# Patient Record
Sex: Female | Born: 1975 | Race: White | Hispanic: No | Marital: Married
Health system: Southern US, Community
[De-identification: ages and names within clinical notes are randomized; demographics above are authoritative.]

## PROBLEM LIST (undated history)

## (undated) DIAGNOSIS — K219 Gastro-esophageal reflux disease without esophagitis: Secondary | ICD-10-CM

## (undated) HISTORY — DX: Gastro-esophageal reflux disease without esophagitis: K21.9

---

## 2007-11-09 ENCOUNTER — Ambulatory Visit (HOSPITAL_BASED_OUTPATIENT_CLINIC_OR_DEPARTMENT_OTHER): Admission: RE | Admit: 2007-11-09 | Discharge: 2007-11-09 | Payer: Self-pay | Admitting: Orthopedic Surgery

## 2010-09-09 NOTE — Op Note (Signed)
NAME:  Gerads, Rheannon                ACCOUNT NO.:  [id number]   MEDICAL RECORD NO.:  [id number]          PATIENT TYPE:  AMB   LOCATION:  DSC                          FACILITY:  [facility]   PHYSICIAN:  Harvie Junior, M.D.   DATE OF BIRTH:  04/16/1976   DATE OF PROCEDURE:  11/09/2007  DATE OF DISCHARGE:                               OPERATIVE REPORT   PREOPERATIVE DIAGNOSES:  Suspected chondral injury, left wrist.   POSTOPERATIVE DIAGNOSES:  Suspected chondral injury, left wrist.   PROCEDURES:  Left wrist arthroscopy with debridement of chondral flap of  synovitis.   SURGEON:  Harvie Junior, M.D.   ASSISTANT:  Marshia Ly, P.A.   ANESTHESIA:  General.   BRIEF HISTORY:  Ms. Moline is a 35 year old female with a long history  of having significant left wrist pain, which we treated conservatively  for a prolonged period of time.  Because of continuing complaints of  pain, she was ultimately taken to the operating room for evaluation.  Preoperative MRI really showed no evidence of ligamentous injury.   PROCEDURE:  The patient was taken to the operating room.  After adequate  anesthesia obtained with general anesthetic, the patient was placed  supine on the operating table.  The left wrist was then prepped and  draped in the usual sterile fashion.  Following this, the arm was  exsanguinated and blood pressure inflated to 250 mmHg.  Following this,  2 elliptical incisions were made just medial and lateral to the fourth  extensor compartment and the wrist arthroscopy was performed.  There was  a significant chondral injury in the dorsal aspect of the lunate, which  was debrided.  There was a couple of chondral loose bodies, which were  identified and debrided.  There was a fair amount of synovitis both on  the radial and ulnar aspects of the wrist.  Triangular fibrocartilage  was evaluated and felt to be normal.  At this point, after this  aggressive and thorough debridement of the  chondral injury and the  synovitis, the joint surface was copiously irrigated, suctioned dry, and  all sterile portals were closed with a single stitch.  Sterile  compression dressing was applied as well as a volar plaster, and the  patient was taken to the recovery room.  She was noted to be in a  satisfactory condition.  Estimated blood loss for this procedure was  none.      Harvie Junior, M.D.  Electronically Signed     JLG/MEDQ  D:  11/09/2007  T:  11/10/2007  Job:  [id number]

## 2011-01-23 LAB — POCT HEMOGLOBIN-HEMACUE: Hemoglobin: 11.9 — ABNORMAL LOW

## 2015-08-29 DIAGNOSIS — M9905 Segmental and somatic dysfunction of pelvic region: Secondary | ICD-10-CM | POA: Diagnosis not present

## 2015-08-29 DIAGNOSIS — M545 Low back pain: Secondary | ICD-10-CM | POA: Diagnosis not present

## 2015-08-29 DIAGNOSIS — M62830 Muscle spasm of back: Secondary | ICD-10-CM | POA: Diagnosis not present

## 2015-08-29 DIAGNOSIS — M9903 Segmental and somatic dysfunction of lumbar region: Secondary | ICD-10-CM | POA: Diagnosis not present

## 2015-09-05 DIAGNOSIS — M9905 Segmental and somatic dysfunction of pelvic region: Secondary | ICD-10-CM | POA: Diagnosis not present

## 2015-09-05 DIAGNOSIS — M9903 Segmental and somatic dysfunction of lumbar region: Secondary | ICD-10-CM | POA: Diagnosis not present

## 2015-09-05 DIAGNOSIS — M62830 Muscle spasm of back: Secondary | ICD-10-CM | POA: Diagnosis not present

## 2015-09-05 DIAGNOSIS — M545 Low back pain: Secondary | ICD-10-CM | POA: Diagnosis not present

## 2015-09-12 DIAGNOSIS — M9905 Segmental and somatic dysfunction of pelvic region: Secondary | ICD-10-CM | POA: Diagnosis not present

## 2015-09-12 DIAGNOSIS — M62830 Muscle spasm of back: Secondary | ICD-10-CM | POA: Diagnosis not present

## 2015-09-12 DIAGNOSIS — M9903 Segmental and somatic dysfunction of lumbar region: Secondary | ICD-10-CM | POA: Diagnosis not present

## 2015-09-12 DIAGNOSIS — M545 Low back pain: Secondary | ICD-10-CM | POA: Diagnosis not present

## 2015-09-26 DIAGNOSIS — M9903 Segmental and somatic dysfunction of lumbar region: Secondary | ICD-10-CM | POA: Diagnosis not present

## 2015-09-26 DIAGNOSIS — M9905 Segmental and somatic dysfunction of pelvic region: Secondary | ICD-10-CM | POA: Diagnosis not present

## 2015-09-26 DIAGNOSIS — M545 Low back pain: Secondary | ICD-10-CM | POA: Diagnosis not present

## 2015-09-26 DIAGNOSIS — M62830 Muscle spasm of back: Secondary | ICD-10-CM | POA: Diagnosis not present

## 2015-10-17 DIAGNOSIS — M9905 Segmental and somatic dysfunction of pelvic region: Secondary | ICD-10-CM | POA: Diagnosis not present

## 2015-10-17 DIAGNOSIS — M62830 Muscle spasm of back: Secondary | ICD-10-CM | POA: Diagnosis not present

## 2015-10-17 DIAGNOSIS — M9903 Segmental and somatic dysfunction of lumbar region: Secondary | ICD-10-CM | POA: Diagnosis not present

## 2015-10-17 DIAGNOSIS — M545 Low back pain: Secondary | ICD-10-CM | POA: Diagnosis not present

## 2015-11-01 DIAGNOSIS — M545 Low back pain: Secondary | ICD-10-CM | POA: Diagnosis not present

## 2015-11-01 DIAGNOSIS — M9905 Segmental and somatic dysfunction of pelvic region: Secondary | ICD-10-CM | POA: Diagnosis not present

## 2015-11-01 DIAGNOSIS — M62830 Muscle spasm of back: Secondary | ICD-10-CM | POA: Diagnosis not present

## 2015-11-01 DIAGNOSIS — M9903 Segmental and somatic dysfunction of lumbar region: Secondary | ICD-10-CM | POA: Diagnosis not present

## 2016-01-23 DIAGNOSIS — Z6841 Body Mass Index (BMI) 40.0 and over, adult: Secondary | ICD-10-CM | POA: Diagnosis not present

## 2016-01-23 DIAGNOSIS — Z0000 Encounter for general adult medical examination without abnormal findings: Secondary | ICD-10-CM | POA: Diagnosis not present

## 2016-03-09 DIAGNOSIS — R55 Syncope and collapse: Secondary | ICD-10-CM | POA: Diagnosis not present

## 2016-04-29 DIAGNOSIS — Z113 Encounter for screening for infections with a predominantly sexual mode of transmission: Secondary | ICD-10-CM | POA: Diagnosis not present

## 2016-04-29 DIAGNOSIS — N760 Acute vaginitis: Secondary | ICD-10-CM | POA: Diagnosis not present

## 2016-04-29 DIAGNOSIS — A630 Anogenital (venereal) warts: Secondary | ICD-10-CM | POA: Diagnosis not present

## 2016-04-30 DIAGNOSIS — A630 Anogenital (venereal) warts: Secondary | ICD-10-CM | POA: Diagnosis not present

## 2016-04-30 DIAGNOSIS — Z113 Encounter for screening for infections with a predominantly sexual mode of transmission: Secondary | ICD-10-CM | POA: Diagnosis not present

## 2016-04-30 DIAGNOSIS — N760 Acute vaginitis: Secondary | ICD-10-CM | POA: Diagnosis not present

## 2016-05-22 DIAGNOSIS — M545 Low back pain: Secondary | ICD-10-CM | POA: Diagnosis not present

## 2016-05-22 DIAGNOSIS — M9905 Segmental and somatic dysfunction of pelvic region: Secondary | ICD-10-CM | POA: Diagnosis not present

## 2016-05-22 DIAGNOSIS — M62830 Muscle spasm of back: Secondary | ICD-10-CM | POA: Diagnosis not present

## 2016-05-22 DIAGNOSIS — M9903 Segmental and somatic dysfunction of lumbar region: Secondary | ICD-10-CM | POA: Diagnosis not present

## 2016-06-25 DIAGNOSIS — Z713 Dietary counseling and surveillance: Secondary | ICD-10-CM | POA: Diagnosis not present

## 2016-06-25 DIAGNOSIS — Z6841 Body Mass Index (BMI) 40.0 and over, adult: Secondary | ICD-10-CM | POA: Diagnosis not present

## 2016-07-30 DIAGNOSIS — Z713 Dietary counseling and surveillance: Secondary | ICD-10-CM | POA: Diagnosis not present

## 2016-10-22 DIAGNOSIS — M9903 Segmental and somatic dysfunction of lumbar region: Secondary | ICD-10-CM | POA: Diagnosis not present

## 2016-10-22 DIAGNOSIS — M62830 Muscle spasm of back: Secondary | ICD-10-CM | POA: Diagnosis not present

## 2016-10-22 DIAGNOSIS — M9905 Segmental and somatic dysfunction of pelvic region: Secondary | ICD-10-CM | POA: Diagnosis not present

## 2016-10-22 DIAGNOSIS — M545 Low back pain: Secondary | ICD-10-CM | POA: Diagnosis not present

## 2016-10-27 DIAGNOSIS — Z6841 Body Mass Index (BMI) 40.0 and over, adult: Secondary | ICD-10-CM | POA: Diagnosis not present

## 2016-10-27 DIAGNOSIS — Z1389 Encounter for screening for other disorder: Secondary | ICD-10-CM | POA: Diagnosis not present

## 2016-10-27 DIAGNOSIS — Z0000 Encounter for general adult medical examination without abnormal findings: Secondary | ICD-10-CM | POA: Diagnosis not present

## 2016-10-29 DIAGNOSIS — M62830 Muscle spasm of back: Secondary | ICD-10-CM | POA: Diagnosis not present

## 2016-10-29 DIAGNOSIS — M545 Low back pain: Secondary | ICD-10-CM | POA: Diagnosis not present

## 2016-10-29 DIAGNOSIS — M9905 Segmental and somatic dysfunction of pelvic region: Secondary | ICD-10-CM | POA: Diagnosis not present

## 2016-10-29 DIAGNOSIS — M9903 Segmental and somatic dysfunction of lumbar region: Secondary | ICD-10-CM | POA: Diagnosis not present

## 2016-11-12 DIAGNOSIS — M9903 Segmental and somatic dysfunction of lumbar region: Secondary | ICD-10-CM | POA: Diagnosis not present

## 2016-11-12 DIAGNOSIS — M545 Low back pain: Secondary | ICD-10-CM | POA: Diagnosis not present

## 2016-11-12 DIAGNOSIS — M62830 Muscle spasm of back: Secondary | ICD-10-CM | POA: Diagnosis not present

## 2016-11-12 DIAGNOSIS — M9905 Segmental and somatic dysfunction of pelvic region: Secondary | ICD-10-CM | POA: Diagnosis not present

## 2016-11-26 DIAGNOSIS — M9903 Segmental and somatic dysfunction of lumbar region: Secondary | ICD-10-CM | POA: Diagnosis not present

## 2016-11-26 DIAGNOSIS — M9905 Segmental and somatic dysfunction of pelvic region: Secondary | ICD-10-CM | POA: Diagnosis not present

## 2016-11-26 DIAGNOSIS — M62830 Muscle spasm of back: Secondary | ICD-10-CM | POA: Diagnosis not present

## 2016-11-26 DIAGNOSIS — M545 Low back pain: Secondary | ICD-10-CM | POA: Diagnosis not present

## 2017-05-13 DIAGNOSIS — M545 Low back pain: Secondary | ICD-10-CM | POA: Diagnosis not present

## 2017-05-13 DIAGNOSIS — M9905 Segmental and somatic dysfunction of pelvic region: Secondary | ICD-10-CM | POA: Diagnosis not present

## 2017-05-13 DIAGNOSIS — M9903 Segmental and somatic dysfunction of lumbar region: Secondary | ICD-10-CM | POA: Diagnosis not present

## 2017-05-13 DIAGNOSIS — M62830 Muscle spasm of back: Secondary | ICD-10-CM | POA: Diagnosis not present

## 2017-08-26 DIAGNOSIS — M6702 Short Achilles tendon (acquired), left ankle: Secondary | ICD-10-CM | POA: Diagnosis not present

## 2017-08-26 DIAGNOSIS — M722 Plantar fascial fibromatosis: Secondary | ICD-10-CM | POA: Diagnosis not present

## 2017-08-26 DIAGNOSIS — M6701 Short Achilles tendon (acquired), right ankle: Secondary | ICD-10-CM | POA: Diagnosis not present

## 2017-10-07 DIAGNOSIS — M722 Plantar fascial fibromatosis: Secondary | ICD-10-CM | POA: Diagnosis not present

## 2017-10-07 DIAGNOSIS — M6702 Short Achilles tendon (acquired), left ankle: Secondary | ICD-10-CM | POA: Diagnosis not present

## 2017-10-07 DIAGNOSIS — M6701 Short Achilles tendon (acquired), right ankle: Secondary | ICD-10-CM | POA: Diagnosis not present

## 2017-11-05 DIAGNOSIS — Z1389 Encounter for screening for other disorder: Secondary | ICD-10-CM | POA: Diagnosis not present

## 2017-11-05 DIAGNOSIS — Z01419 Encounter for gynecological examination (general) (routine) without abnormal findings: Secondary | ICD-10-CM | POA: Diagnosis not present

## 2017-11-05 DIAGNOSIS — Z6841 Body Mass Index (BMI) 40.0 and over, adult: Secondary | ICD-10-CM | POA: Diagnosis not present

## 2017-11-25 DIAGNOSIS — Z01419 Encounter for gynecological examination (general) (routine) without abnormal findings: Secondary | ICD-10-CM | POA: Diagnosis not present

## 2017-11-25 DIAGNOSIS — Z6841 Body Mass Index (BMI) 40.0 and over, adult: Secondary | ICD-10-CM | POA: Diagnosis not present

## 2017-11-25 DIAGNOSIS — Z1389 Encounter for screening for other disorder: Secondary | ICD-10-CM | POA: Diagnosis not present

## 2017-11-26 DIAGNOSIS — Z1231 Encounter for screening mammogram for malignant neoplasm of breast: Secondary | ICD-10-CM | POA: Diagnosis not present

## 2018-03-03 DIAGNOSIS — M62830 Muscle spasm of back: Secondary | ICD-10-CM | POA: Diagnosis not present

## 2018-03-03 DIAGNOSIS — M9902 Segmental and somatic dysfunction of thoracic region: Secondary | ICD-10-CM | POA: Diagnosis not present

## 2018-03-03 DIAGNOSIS — M9903 Segmental and somatic dysfunction of lumbar region: Secondary | ICD-10-CM | POA: Diagnosis not present

## 2018-03-03 DIAGNOSIS — M545 Low back pain: Secondary | ICD-10-CM | POA: Diagnosis not present

## 2018-04-18 DIAGNOSIS — L919 Hypertrophic disorder of the skin, unspecified: Secondary | ICD-10-CM | POA: Diagnosis not present

## 2018-04-21 DIAGNOSIS — M25562 Pain in left knee: Secondary | ICD-10-CM | POA: Diagnosis not present

## 2018-04-21 DIAGNOSIS — Z6841 Body Mass Index (BMI) 40.0 and over, adult: Secondary | ICD-10-CM | POA: Diagnosis not present

## 2018-04-26 DIAGNOSIS — M25562 Pain in left knee: Secondary | ICD-10-CM | POA: Diagnosis not present

## 2018-05-26 DIAGNOSIS — M25562 Pain in left knee: Secondary | ICD-10-CM | POA: Diagnosis not present

## 2018-07-29 DIAGNOSIS — M9903 Segmental and somatic dysfunction of lumbar region: Secondary | ICD-10-CM | POA: Diagnosis not present

## 2018-07-29 DIAGNOSIS — M62830 Muscle spasm of back: Secondary | ICD-10-CM | POA: Diagnosis not present

## 2018-07-29 DIAGNOSIS — M545 Low back pain: Secondary | ICD-10-CM | POA: Diagnosis not present

## 2018-07-29 DIAGNOSIS — M9902 Segmental and somatic dysfunction of thoracic region: Secondary | ICD-10-CM | POA: Diagnosis not present

## 2018-08-18 DIAGNOSIS — M62830 Muscle spasm of back: Secondary | ICD-10-CM | POA: Diagnosis not present

## 2018-08-18 DIAGNOSIS — M9902 Segmental and somatic dysfunction of thoracic region: Secondary | ICD-10-CM | POA: Diagnosis not present

## 2018-08-18 DIAGNOSIS — M9903 Segmental and somatic dysfunction of lumbar region: Secondary | ICD-10-CM | POA: Diagnosis not present

## 2018-08-18 DIAGNOSIS — M545 Low back pain: Secondary | ICD-10-CM | POA: Diagnosis not present

## 2018-08-25 DIAGNOSIS — M25562 Pain in left knee: Secondary | ICD-10-CM | POA: Diagnosis not present

## 2018-10-13 ENCOUNTER — Other Ambulatory Visit: Payer: Self-pay | Admitting: General Surgery

## 2018-10-13 ENCOUNTER — Other Ambulatory Visit (HOSPITAL_COMMUNITY): Payer: Self-pay | Admitting: General Surgery

## 2018-10-18 ENCOUNTER — Other Ambulatory Visit: Payer: Self-pay

## 2018-10-18 ENCOUNTER — Other Ambulatory Visit (HOSPITAL_COMMUNITY): Payer: Self-pay | Admitting: General Surgery

## 2018-10-18 ENCOUNTER — Ambulatory Visit (HOSPITAL_COMMUNITY)
Admission: RE | Admit: 2018-10-18 | Discharge: 2018-10-18 | Disposition: A | Discharge status: Home / Self Care | Payer: BC Managed Care – PPO | Source: Ambulatory Visit | Attending: General Surgery | Admitting: General Surgery

## 2018-10-18 DIAGNOSIS — K224 Dyskinesia of esophagus: Secondary | ICD-10-CM | POA: Diagnosis not present

## 2018-10-18 DIAGNOSIS — R918 Other nonspecific abnormal finding of lung field: Secondary | ICD-10-CM | POA: Diagnosis not present

## 2018-10-27 DIAGNOSIS — M9902 Segmental and somatic dysfunction of thoracic region: Secondary | ICD-10-CM | POA: Diagnosis not present

## 2018-10-27 DIAGNOSIS — M545 Low back pain: Secondary | ICD-10-CM | POA: Diagnosis not present

## 2018-10-27 DIAGNOSIS — M62830 Muscle spasm of back: Secondary | ICD-10-CM | POA: Diagnosis not present

## 2018-10-27 DIAGNOSIS — M9903 Segmental and somatic dysfunction of lumbar region: Secondary | ICD-10-CM | POA: Diagnosis not present

## 2018-11-03 DIAGNOSIS — Z6841 Body Mass Index (BMI) 40.0 and over, adult: Secondary | ICD-10-CM | POA: Diagnosis not present

## 2018-11-03 DIAGNOSIS — Z0000 Encounter for general adult medical examination without abnormal findings: Secondary | ICD-10-CM | POA: Diagnosis not present

## 2018-11-03 DIAGNOSIS — Z1389 Encounter for screening for other disorder: Secondary | ICD-10-CM | POA: Diagnosis not present

## 2018-11-08 DIAGNOSIS — M6702 Short Achilles tendon (acquired), left ankle: Secondary | ICD-10-CM | POA: Diagnosis not present

## 2018-11-08 DIAGNOSIS — M722 Plantar fascial fibromatosis: Secondary | ICD-10-CM | POA: Diagnosis not present

## 2018-11-10 ENCOUNTER — Encounter: Payer: BC Managed Care – PPO | Attending: General Surgery | Admitting: Dietician

## 2018-11-10 ENCOUNTER — Encounter: Payer: Self-pay | Admitting: Dietician

## 2018-11-10 ENCOUNTER — Other Ambulatory Visit: Payer: Self-pay

## 2018-11-10 DIAGNOSIS — E669 Obesity, unspecified: Secondary | ICD-10-CM | POA: Diagnosis not present

## 2018-11-10 NOTE — Patient Instructions (Addendum)
   Track your food and beverage intake by writing it down or using an app.   Continue to cut back on sugar-sweetened beverages. Great job of drinking plenty of fluids in general!  Reach out to [employer] company about your supervised weight loss visit requirements.   See you next month for your 1st supervised visit!

## 2018-11-10 NOTE — Progress Notes (Signed)
Bariatric Pre-Op Nutrition Assessment Medical Nutrition Therapy  Appt Start Time: 9:00am  End time: 9:45am  Patient was seen on 11/10/2018 for Pre-Operative Nutrition Assessment. Assessment and [facility] on 11/10/2018.   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 Lives with her husband, has a Museum/gallery conservatorstepdaughter. Seems family oriented and wants to have a healthier life so that she can be there to take care of her family.   24-Hr Dietary Recall First Meal: skips Snack: Just Crack an Egg (or toast + 2 boiled eggs; or cereal; or fast food biscuit)  Second Meal: sandwich (or leftovers; or hot dog)  Snack: fruit (cherries or grapes) + cinnamon toast (or Cheetos)  Third Meal: [employer] 2 cheese roll ups + pico de gallo (or pork roast + rice + peas + squash + cabbage)  Snack: skips Beverages: water, sweet tea, 1/2 sweet/unsweet tea, soda (1/day)   Food & Nutrition Related Hx Dietary Hx: Dislikes shellfish and oatmeal. Often eats leftovers, meal preps a lot of food at once to eat for lunch and dinner throughout the week.   Estimated Daily Fluid Intake: 64+ oz Supplements: MVI, turmeric, biotin, CoQ10  GI / Other Notable Symptoms: heartburn   Physical Activity  Current average weekly physical activity: ADLs  Estimated Energy Needs Calories: 1600 Carbohydrate: 180g Protein: 100g Fat: 53g  Pre-Op Goals Reviewed with the Patient . Track food and beverage  intake (try MyFitness Pal or the [employer]it[employer] app) . Make healthy food choices while monitoring portion sizes . Consume 3 meals per day or try to eat every 3-5 hours . Avoid concentrated sugars and fried foods . Keep sugar & fat in the single digits per serving on food labels . Practice CHEWING your food (aim for applesauce consistency) . Practice not drinking 15 minutes before, during, and 30 minutes after each meal and snack . Avoid all carbonated beverages (ex: soda, sparkling beverages)  . Limit caffeinated beverages (ex: coffee, tea, energy drinks) . Avoid all sugar-sweetened beverages (ex: regular soda, sports drinks)  . Avoid alcohol  . Aim for 64-100 ounces of FLUID daily (with at least half of fluid intake being plain water)  . Aim for at least 60-80 grams of PROTEIN daily . Look for a liquid protein source that contains ?15 g protein and ?5 g carbohydrate (ex: shakes, drinks, shots) . Make a list of non-food related activities . Physical activity is an important part of a healthy lifestyle so keep it moving! The goal is to reach 150 minutes of exercise per week, including cardiovascular and weight baring activity.  Handouts Provided Include  . Bariatric Surgery handouts (Nutrition Visits, Pre-Op Goals, Protein Shakes, Vitamins & Minerals)  Learning Style & Readiness for Change Teaching method utilized: Visual & Auditory  Demonstrated degree of understanding via: Teach Back  Barriers to learning/adherence to lifestyle change: None Identified   RD's Notes for Next Visit . Review remaining pre-op goals (starting with protein), protein shakes handout, and vitamins/minerals   Next Steps Supervised  Weight Loss (SWL) Visits Needed: 6 (Pt is going to call insurance company about requirements, she would like to have surgery before the end of this year.)  [facility] is to return to [facility] in 1 month for 1st SWL Visit.  Patient is to call [facility] to enroll in Pre-Op Class (>2 weeks before  surgery) and Post-Op Class (2 weeks after surgery) for further nutrition education when surgery date is scheduled.

## 2018-12-08 ENCOUNTER — Encounter: Payer: BC Managed Care – PPO | Attending: General Surgery | Admitting: Dietician

## 2018-12-08 ENCOUNTER — Encounter: Payer: Self-pay | Admitting: Dietician

## 2018-12-08 ENCOUNTER — Other Ambulatory Visit: Payer: Self-pay

## 2018-12-08 DIAGNOSIS — E669 Obesity, unspecified: Secondary | ICD-10-CM

## 2018-12-08 NOTE — Progress Notes (Signed)
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 Seems family oriented and wants to have a healthier life so that she can be there to take care of her family.  Food & Nutrition Related Hx Dietary Hx: Pt states she has cut her soda intake to 1 small/day. Does not drink as many sugar-sweetened beverages now, such as sweet tea, states they are too sweet. Will add water or unsweet tea to help cut the sweetness. Working towards eating at least 3 times per day.  Estimated Daily Fluid Intake: 64+ oz Supplements: MVI, turmeric, biotin, CoQ10  GI / Other Notable Symptoms: heartburn  Physical Activity  Current average weekly physical activity: ADLs  Estimated Energy Needs Calories: 1600 Carbohydrate: 180g Protein: 100g Fat: 53g   NUTRITION DIAGNOSIS  Overweight/obesity ([place]-3.3) related to past poor dietary habits and physical inactivity as evidenced by patient w/ planned Sleeve Gastrectomy surgery following dietary guidelines for continued weight loss.   NUTRITION INTERVENTION  Nutrition counseling (C-1) and education (E-2) to facilitate bariatric surgery goals.  Pre-Op Goals Progress & New Goals . Decreasing intake of caffeinated, carbonated, and sugar sweetened beverages  . Meeting daily fluid goal  . Aiming to eat at least 3 times per day  . Trying out different protein shakes, has not found any she likes  . NEW: Work on chewing foods thoroughly    Handouts Provided Include   Bariatric Protein  Shakes   Learning Style & Readiness for Change Teaching method utilized: Visual & Auditory  Demonstrated degree of understanding via: Teach Back  Barriers to learning/adherence to lifestyle change: None Identified   RD's Notes for next Visit  . Vitamins/Minerals handout  . Tracking food and beverage intake    MONITORING & EVALUATION Dietary intake, weekly physical activity, body weight, and pre-op goals in 1 month.   (Pt is going to call insurance company about requirements, she would like to have surgery before the end of this year. Referral states pt needs 6 SWL Visits prior to surgery.)  Next Steps  Patient is to return to [facility] in 1 month for 2nd SWL Visit.

## 2018-12-08 NOTE — Patient Instructions (Addendum)
Continue working through the [employer], especially focusing on chewing.  Great job of cutting back on sodas and sugar-sweetened beverages!! This is awesome progress! Keep up the good work of making small, incremental changes over time.  See you next month!

## 2018-12-15 DIAGNOSIS — M9903 Segmental and somatic dysfunction of lumbar region: Secondary | ICD-10-CM | POA: Diagnosis not present

## 2018-12-15 DIAGNOSIS — M545 Low back pain: Secondary | ICD-10-CM | POA: Diagnosis not present

## 2018-12-15 DIAGNOSIS — M9902 Segmental and somatic dysfunction of thoracic region: Secondary | ICD-10-CM | POA: Diagnosis not present

## 2018-12-15 DIAGNOSIS — M62830 Muscle spasm of back: Secondary | ICD-10-CM | POA: Diagnosis not present

## 2018-12-19 DIAGNOSIS — Z3009 Encounter for other general counseling and advice on contraception: Secondary | ICD-10-CM | POA: Diagnosis not present

## 2018-12-19 DIAGNOSIS — Z6841 Body Mass Index (BMI) 40.0 and over, adult: Secondary | ICD-10-CM | POA: Diagnosis not present

## 2018-12-30 DIAGNOSIS — M9903 Segmental and somatic dysfunction of lumbar region: Secondary | ICD-10-CM | POA: Diagnosis not present

## 2018-12-30 DIAGNOSIS — M9902 Segmental and somatic dysfunction of thoracic region: Secondary | ICD-10-CM | POA: Diagnosis not present

## 2018-12-30 DIAGNOSIS — M545 Low back pain: Secondary | ICD-10-CM | POA: Diagnosis not present

## 2018-12-30 DIAGNOSIS — M62830 Muscle spasm of back: Secondary | ICD-10-CM | POA: Diagnosis not present

## 2019-01-05 ENCOUNTER — Other Ambulatory Visit: Payer: Self-pay

## 2019-01-05 ENCOUNTER — Encounter: Payer: Self-pay | Admitting: Dietician

## 2019-01-05 ENCOUNTER — Encounter: Payer: BC Managed Care – PPO | Attending: General Surgery | Admitting: Dietician

## 2019-01-05 DIAGNOSIS — E669 Obesity, unspecified: Secondary | ICD-10-CM | POA: Insufficient documentation

## 2019-01-05 NOTE — Progress Notes (Signed)
Bariatric Supervised Weight Loss Visit Appt Start Time: 8:05am   End Time: 8:35am  Planned Surgery: Sleeve Gastrectomy  Pt expectation of surgery: relieve pain, be healthy, live a long life to be around for grandchildren, to walk more easily and be physically active    2nd out of 6 SWL Appointments     NUTRITION ASSESSMENT  Anthropometrics  Start weight at [facility]: 259.3 lbs (date: 11/10/2018) Today's weight: 253.3lbs  262.1 lbs Weight change: -8.8 lbs (since previous visit on 12/08/2018) BMI: 46.3 kg/m2    Clinical  Medical Hx: obesity, GERD Medications: omeprazole, meloxicam   Psychosocial/Lifestyle 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 Seems family oriented and wants to have a healthier life so that she can be there to take care of her family.  24-Hr Dietary Recall  First Meal: egg  Snack: fruit  Second Meal: wrap (chicken, or pimento cheese, or chicken salad)  Snack: fruit (or granola bar)   Third Meal: chicken thighs + potato + mixed vegetables  Snack: none stated  Beverages: half and half tea, water    Food & Nutrition Related Hx Dietary Hx: Continuing to cut out sugar-sweetened beverages such as sweet tea, states they are too sweet. Will add unsweet tea to help cut the sweetness, and been trying out "diet" versions. Now eating at least 3 times per day! States she always does eggs for breakfast but she is getting tired of them, but wants to eat them because she knows they are good protein source. States that textures are important to her, can't eat certain foods d/t texture (such as seafood, yogurt, and gummy foods.)   States she is proud of herself for her weight loss success over the past month, especially considering she went on vacation with family and still managed to lose weight.   Estimated Daily Fluid Intake: 64+ oz Supplements: MVI, turmeric, biotin, CoQ10  GI / Other Notable Symptoms: heartburn  Physical Activity   Current average weekly physical activity: ADLs  Estimated Energy Needs Calories: 1600 Carbohydrate: 180g Protein: 100g Fat: 53g   NUTRITION DIAGNOSIS  Overweight/obesity ([place]-3.3) related to past poor dietary habits and physical inactivity as evidenced by patient w/ planned Sleeve Gastrectomy surgery following dietary guidelines for continued weight loss.   NUTRITION INTERVENTION  Nutrition counseling (C-1) and education (E-2) to facilitate bariatric surgery goals.  Pre-Op Goals Progress & New Goals . Decreasing intake of caffeinated, carbonated, and sugar sweetened beverages  . Meeting daily fluid goal  . Eating at least 3 times per day  . Trying out different protein shakes, has not found any she likes  . Working on chewing foods thoroughly   . NEW: Do not drink fluids with meals   Handouts Provided Include   Breakfast Ideas   Learning Style & Readiness for Change Teaching method utilized: Visual & Auditory  Demonstrated degree of understanding via: Teach Back  Barriers to learning/adherence to lifestyle change: None Identified   RD's Notes for next Visit  . Tracking food and beverage intake    MONITORING & EVALUATION Dietary intake, weekly physical activity, body weight, and pre-op goals in 1 month.   Next Steps  Patient is to return to [facility] in 1 month for 3rd SWL Visit.

## 2019-01-05 NOTE — Patient Instructions (Signed)
   Great job at making good food choices and cutting back on sugar!! Continue to work on chewing thoroughly and making small bites.   Try to cut back more and more with how much fluids you drink with meals. The goal is to stop 15 minutes before eating and not drink for 30 minutes after.   Use the Breakfast Ideas sheet for new breakfast ideas!   See you next month!

## 2019-01-12 DIAGNOSIS — M545 Low back pain: Secondary | ICD-10-CM | POA: Diagnosis not present

## 2019-01-12 DIAGNOSIS — M62830 Muscle spasm of back: Secondary | ICD-10-CM | POA: Diagnosis not present

## 2019-01-12 DIAGNOSIS — M9903 Segmental and somatic dysfunction of lumbar region: Secondary | ICD-10-CM | POA: Diagnosis not present

## 2019-01-12 DIAGNOSIS — M9902 Segmental and somatic dysfunction of thoracic region: Secondary | ICD-10-CM | POA: Diagnosis not present

## 2019-01-26 DIAGNOSIS — M62830 Muscle spasm of back: Secondary | ICD-10-CM | POA: Diagnosis not present

## 2019-01-26 DIAGNOSIS — M9903 Segmental and somatic dysfunction of lumbar region: Secondary | ICD-10-CM | POA: Diagnosis not present

## 2019-01-26 DIAGNOSIS — M9902 Segmental and somatic dysfunction of thoracic region: Secondary | ICD-10-CM | POA: Diagnosis not present

## 2019-01-26 DIAGNOSIS — M545 Low back pain: Secondary | ICD-10-CM | POA: Diagnosis not present

## 2019-02-02 DIAGNOSIS — Z1231 Encounter for screening mammogram for malignant neoplasm of breast: Secondary | ICD-10-CM | POA: Diagnosis not present

## 2019-02-07 ENCOUNTER — Encounter: Payer: Self-pay | Admitting: Dietician

## 2019-02-07 ENCOUNTER — Other Ambulatory Visit: Payer: Self-pay

## 2019-02-07 ENCOUNTER — Encounter: Payer: BC Managed Care – PPO | Attending: General Surgery | Admitting: Dietician

## 2019-02-07 DIAGNOSIS — E669 Obesity, unspecified: Secondary | ICD-10-CM | POA: Diagnosis not present

## 2019-02-07 NOTE — Progress Notes (Signed)
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 Seems family oriented and wants to have a healthier life so that she can be there to take care of her family. Continuing to decrease sugar intake. Eating breakfast, and has found more variety to incorporate since she was getting tired of eating eggs everyday, which aren't her favorite. Increasing walking throughout the week. Working on not drinking with meals and increasing fluid intake. Bought another water bottle to have at work so can keep one at home.  Patient has done a fantastic job with becoming more mindful about foods and what she eats. States she has never been more aware of what she eats and why. States one of her biggest goals currently is to continue working on decreasing portion sizes. States she is somewhat familiar with MyFitness Pal as a way to track food intake and would like to work on this at least a day or 2 per week.   24-Hr Dietary Recall  First Meal: yogurt + fruit + granola   Snack: skinny bagel + egg + cheese + canadian bacon + spinach   Second Meal: skinny bagel + chicken breast + cheese + spinach  Snack: yogurt + nuts + dried fruit (or 100 calorie pack nuts)  Third Meal: chicken thighs + potato + mixed vegetables  Snack: none stated  Beverages: half and half tea, water    Estimated  Daily Fluid Intake: 64+ oz Supplements: MVI, turmeric, biotin, CoQ10  GI / Other Notable Symptoms: heartburn  Physical Activity  Current average weekly physical activity: ADLs, walking   Estimated Energy Needs Calories: 1600 Carbohydrate: 180g Protein: 100g Fat: 53g   NUTRITION DIAGNOSIS  Overweight/obesity ([place]-3.3) related to past poor dietary habits and physical inactivity as evidenced by patient w/ planned Sleeve Gastrectomy surgery following dietary guidelines for continued weight loss.   NUTRITION INTERVENTION  Nutrition counseling (C-1) and education (E-2) to facilitate bariatric surgery goals.  Patient was praised for her progress thus far. She has made many positive changes, most notably gaining more awareness of what she is eating and working hard at incorporating new/unfamiliar habits such as chewing thoroughly, decreasing added sugar intake, and not drinking with meals. We briefly discussed what to expect post operatively regarding diet phases. We reviewed lean proteins and why this is important pre and post operatively.    Pre-Op Goals Progress & New Goals . Decreasing intake of caffeinated, carbonated, and sugar sweetened beverages  . Meeting daily fluid goal  . Eating at least 3 times per day  . Trying out different protein shakes, has not found any she likes  . Working on chewing foods thoroughly   . Working on not drinking fluids with meals  . NEW: Track food and beverage intake  Learning Style & Readiness for Change Teaching method utilized: Visual & Auditory  Demonstrated degree of  understanding via: Teach Back  Barriers to learning/adherence to lifestyle change: None Identified   RD's Notes for next Visit  . Updates on progress with goals, especially food logging  . Physical activity     MONITORING & EVALUATION Dietary intake, weekly physical activity, body weight, and pre-op goals in 1 month.   Next Steps  Patient is to return to [facility] in 1 month for  4th SWL Visit.

## 2019-02-16 DIAGNOSIS — M62830 Muscle spasm of back: Secondary | ICD-10-CM | POA: Diagnosis not present

## 2019-02-16 DIAGNOSIS — M9902 Segmental and somatic dysfunction of thoracic region: Secondary | ICD-10-CM | POA: Diagnosis not present

## 2019-02-16 DIAGNOSIS — M545 Low back pain: Secondary | ICD-10-CM | POA: Diagnosis not present

## 2019-02-16 DIAGNOSIS — M9903 Segmental and somatic dysfunction of lumbar region: Secondary | ICD-10-CM | POA: Diagnosis not present

## 2019-02-27 ENCOUNTER — Ambulatory Visit (INDEPENDENT_AMBULATORY_CARE_PROVIDER_SITE_OTHER): Payer: BC Managed Care – PPO | Admitting: Psychology

## 2019-02-27 ENCOUNTER — Ambulatory Visit: Payer: BC Managed Care – PPO | Admitting: Psychology

## 2019-03-02 ENCOUNTER — Other Ambulatory Visit: Payer: Self-pay

## 2019-03-02 ENCOUNTER — Encounter: Payer: Self-pay | Admitting: Dietician

## 2019-03-02 ENCOUNTER — Encounter: Payer: BC Managed Care – PPO | Attending: General Surgery | Admitting: Dietician

## 2019-03-02 DIAGNOSIS — E669 Obesity, unspecified: Secondary | ICD-10-CM | POA: Insufficient documentation

## 2019-03-02 NOTE — Progress Notes (Signed)
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 Seems family oriented and wants to have a healthier life so that she can be there to take care of her family. Continuing to decrease sugar intake. Eating frequent meals and snacks during the day. Working on not drinking with meals and increasing fluid intake, has two 32 ounce water bottles for home and work.   Patient continues to do a great job with being more mindful about foods and what she eats. States there has been a lot going on over the past few weeks which has prevented her from tracking her food/beverage intake like she intended to.   24-Hr Dietary Recall  First Meal: yogurt + fruit + granola   Snack: skinny bagel + egg + cheese + canadian bacon + spinach   Second Meal: skinny bagel + chicken breast + cheese + spinach  Snack: yogurt + 100 calorie pack nuts  Third Meal: soup  Snack: none stated  Beverages: half and half tea, water    Estimated Daily Fluid Intake: 64+ oz Supplements: MVI, turmeric, biotin, CoQ10  GI / Other Notable Symptoms: heartburn   Physical Activity  Current average weekly physical activity: ADLs, walking   Estimated Energy Needs Calories: 1600 Carbohydrate: 180g Protein: 100g Fat: 53g   NUTRITION DIAGNOSIS  Overweight/obesity ([place]-3.3) related to past poor dietary habits  and physical inactivity as evidenced by patient w/ planned Sleeve Gastrectomy surgery following dietary guidelines for continued weight loss.   NUTRITION INTERVENTION  Nutrition counseling (C-1) and education (E-2) to facilitate bariatric surgery goals.  Patient was praised for her progress thus far. She has made many positive changes, most notably gaining more awareness of what she is eating and working hard at incorporating new/unfamiliar habits such as chewing thoroughly, decreasing added sugar intake, and not drinking with meals. We briefly discussed what to expect post operatively regarding diet phases. We reviewed lean proteins and why this is important pre and post operatively.    Pre-Op Goals Progress & New Goals . Decreasing intake of caffeinated, carbonated, and sugar sweetened beverages  . Meeting daily fluid goal  . Eating at least 3 times per day  . Trying out different protein shakes, has not found any she likes  . Working on chewing foods thoroughly   . Working on not drinking fluids with meals  . Has not started tracking food and beverage intake  . NEW: Work on being more physically active, such as trying chair exercises at home  Handouts Provided  ADA 3 Types of Exercise  Learning Style & Readiness for Change Teaching method utilized: Visual & Auditory  Demonstrated degree of understanding via: Teach Back  Barriers to learning/adherence to lifestyle change: None Identified   RD's Notes for next Visit   Protein (daily amount, shakes)  MONITORING & EVALUATION Dietary intake, weekly physical activity, body  weight, and pre-op goals in 1 month.   Next Steps  Patient is to return to [facility] in 1 month for 5th SWL Visit.

## 2019-03-13 ENCOUNTER — Ambulatory Visit: Payer: BC Managed Care – PPO | Admitting: Psychology

## 2019-03-13 ENCOUNTER — Ambulatory Visit (INDEPENDENT_AMBULATORY_CARE_PROVIDER_SITE_OTHER): Payer: BC Managed Care – PPO | Admitting: Psychology

## 2019-03-16 DIAGNOSIS — M9903 Segmental and somatic dysfunction of lumbar region: Secondary | ICD-10-CM | POA: Diagnosis not present

## 2019-03-16 DIAGNOSIS — M9902 Segmental and somatic dysfunction of thoracic region: Secondary | ICD-10-CM | POA: Diagnosis not present

## 2019-03-16 DIAGNOSIS — M62830 Muscle spasm of back: Secondary | ICD-10-CM | POA: Diagnosis not present

## 2019-03-16 DIAGNOSIS — M545 Low back pain: Secondary | ICD-10-CM | POA: Diagnosis not present

## 2019-03-30 ENCOUNTER — Encounter: Payer: BC Managed Care – PPO | Attending: General Surgery | Admitting: Dietician

## 2019-03-30 ENCOUNTER — Other Ambulatory Visit: Payer: Self-pay

## 2019-03-30 ENCOUNTER — Encounter: Payer: Self-pay | Admitting: Dietician

## 2019-03-30 DIAGNOSIS — E669 Obesity, unspecified: Secondary | ICD-10-CM | POA: Diagnosis not present

## 2019-03-30 NOTE — Progress Notes (Signed)
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 Seems family oriented and wants to have a healthier life so that she can be there to take care of her family. Continuing to decrease sugar intake. Eating frequent meals and snacks during the day. Working on not drinking with meals and increasing fluid intake, has two 32 ounce water bottles for home and work.   Patient continues to do a great job with being more mindful about foods and what she eats. Started taking Tespo bariatric MVI and Tums for calcium.   24-Hr Dietary Recall  First Meal: yogurt + fruit + granola   Snack: skinny bagel + egg + cheese + canadian bacon + spinach   Second Meal: skinny bagel + chicken breast + cheese + spinach  Snack: yogurt + 100 calorie pack nuts  Third Meal: soup  Snack: none stated  Beverages: half and half tea, water    Estimated Daily Fluid Intake: 64+ oz Supplements: bariatric Tespo MVI, turmeric, biotin, CoQ10, Tums (for calcium)  GI / Other Notable Symptoms: heartburn   Physical Activity  Current average weekly physical activity: ADLs, walking   Estimated Energy Needs Calories: 1600 Carbohydrate: 180g Protein: 100g Fat: 53g   NUTRITION DIAGNOSIS  Overweight/obesity ([place]-3.3) related to past poor dietary habits and physical inactivity as evidenced by patient w/  planned Sleeve Gastrectomy surgery following dietary guidelines for continued weight loss.   NUTRITION INTERVENTION  Nutrition counseling (C-1) and education (E-2) to facilitate bariatric surgery goals.  Patient was praised for her progress thus far. She has made many positive changes, most notably gaining more awareness of what she is eating and working hard at incorporating new/unfamiliar habits such as chewing thoroughly, decreasing added sugar intake, and not drinking with meals. We briefly discussed what to expect post operatively regarding diet phases. We reviewed lean proteins and why this is important pre and post operatively.    Pre-Op Goals Progress & New Goals . Decreasing intake of caffeinated, carbonated, and sugar sweetened beverages  . Meeting daily fluid goal  . Eating at least 3 times per day  . Trying out different protein shakes, has not found any she likes  . Working on chewing foods thoroughly   . Working on not drinking fluids with meals  . Has not started tracking food and beverage intake  . Working on being more physically active, such as trying chair exercises and yoga at home  [employer] Teaching method utilized: Visual & Auditory  Demonstrated degree of understanding via: Teach Back  Barriers to learning/adherence to lifestyle change: None Identified    MONITORING & EVALUATION Dietary intake, weekly physical activity, body weight, and pre-op goals in 1 month.   Next Steps  Patient is to return to [facility] in 1 month for 6th SWL Visit.

## 2019-05-04 ENCOUNTER — Other Ambulatory Visit: Payer: Self-pay

## 2019-05-04 ENCOUNTER — Encounter: Payer: BC Managed Care – PPO | Attending: General Surgery | Admitting: Dietician

## 2019-05-04 ENCOUNTER — Encounter: Payer: Self-pay | Admitting: Dietician

## 2019-05-04 DIAGNOSIS — E669 Obesity, unspecified: Secondary | ICD-10-CM | POA: Insufficient documentation

## 2019-05-04 NOTE — Progress Notes (Signed)
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 Seems family oriented and wants to have a healthier life so that she can be there to take care of her family. Continuing to decrease sugar intake. Eating frequent meals and snacks during the day. Working on not drinking with meals and increasing fluid intake, has two 32 ounce water bottles for home and work.   Patient continues to do a great job with being more mindful about foods and what she eats.  24-Hr Dietary Recall  First Meal: yogurt + fruit + granola   Snack: skinny bagel + egg + cheese + canadian bacon + spinach   Second Meal: skinny bagel + chicken breast + cheese + spinach  Snack: yogurt + 100 calorie pack nuts  Third Meal: soup  Snack: none stated  Beverages: half and half tea, water    Estimated Daily Fluid Intake: 64+ oz Supplements: MVI, turmeric, biotin, CoQ10, Tums (for calcium)   Physical Activity  Current average weekly physical activity: ADLs, walking   Estimated Energy Needs Calories: 1600 Carbohydrate: 180g Protein: 100g Fat: 53g   NUTRITION DIAGNOSIS  Overweight/obesity ([place]-3.3) related to past poor dietary habits and physical inactivity as evidenced by patient w/ planned Sleeve Gastrectomy surgery following dietary guidelines for continued weight loss.   NUTRITION INTERVENTION  Nutrition counseling (C-1) and education (E-2) to facilitate bariatric surgery goals.  Patient was  praised for her progress thus far. She has made many positive changes, most notably gaining more awareness of what she is eating and working hard at incorporating new/unfamiliar habits such as chewing thoroughly, decreasing added sugar intake, and not drinking with meals. We briefly discussed what to expect post operatively regarding diet phases. We reviewed lean proteins and why this is important pre and post operatively.    Pre-Op Goals Progress & New Goals . Decreasing intake of caffeinated, carbonated, and sugar sweetened beverages  . Meeting daily fluid goal  . Eating at least 3 times per day  . Trying out different protein shakes, has not found any she likes  . Working on chewing foods thoroughly   . Working on not drinking fluids with meals  . Has not started tracking food and beverage intake  . Working on being more physically active, such as trying chair exercises and yoga at home  Learning Style [employer] for Change Teaching method utilized: Visual & Auditory  Demonstrated degree of understanding via: Teach Back  Barriers to learning/adherence to lifestyle change: None Identified    MONITORING & EVALUATION Dietary intake, weekly physical activity, body weight, and pre-op goals at next nutrition visit.   Next Steps  Patient is to return to [facility] for Pre-Op Class >2 weeks before surgery.

## 2019-07-03 DIAGNOSIS — R112 Nausea with vomiting, unspecified: Secondary | ICD-10-CM | POA: Diagnosis not present

## 2019-07-21 DIAGNOSIS — Z0000 Encounter for general adult medical examination without abnormal findings: Secondary | ICD-10-CM | POA: Diagnosis not present

## 2019-07-21 DIAGNOSIS — Z6841 Body Mass Index (BMI) 40.0 and over, adult: Secondary | ICD-10-CM | POA: Diagnosis not present

## 2019-08-31 DIAGNOSIS — N920 Excessive and frequent menstruation with regular cycle: Secondary | ICD-10-CM | POA: Diagnosis not present

## 2019-08-31 DIAGNOSIS — Z8742 Personal history of other diseases of the female genital tract: Secondary | ICD-10-CM | POA: Diagnosis not present

## 2019-11-02 DIAGNOSIS — Z30430 Encounter for insertion of intrauterine contraceptive device: Secondary | ICD-10-CM | POA: Diagnosis not present

## 2019-11-02 DIAGNOSIS — Z3202 Encounter for pregnancy test, result negative: Secondary | ICD-10-CM | POA: Diagnosis not present

## 2019-11-02 DIAGNOSIS — Z8742 Personal history of other diseases of the female genital tract: Secondary | ICD-10-CM | POA: Diagnosis not present

## 2019-11-02 DIAGNOSIS — N920 Excessive and frequent menstruation with regular cycle: Secondary | ICD-10-CM | POA: Diagnosis not present

## 2019-12-13 DIAGNOSIS — Z30431 Encounter for routine checking of intrauterine contraceptive device: Secondary | ICD-10-CM | POA: Diagnosis not present

## 2019-12-22 DIAGNOSIS — J0100 Acute maxillary sinusitis, unspecified: Secondary | ICD-10-CM | POA: Diagnosis not present

## 2020-02-08 DIAGNOSIS — Z1231 Encounter for screening mammogram for malignant neoplasm of breast: Secondary | ICD-10-CM | POA: Diagnosis not present

## 2020-03-18 DIAGNOSIS — M9902 Segmental and somatic dysfunction of thoracic region: Secondary | ICD-10-CM | POA: Diagnosis not present

## 2020-03-18 DIAGNOSIS — M9903 Segmental and somatic dysfunction of lumbar region: Secondary | ICD-10-CM | POA: Diagnosis not present

## 2020-03-18 DIAGNOSIS — M5451 Vertebrogenic low back pain: Secondary | ICD-10-CM | POA: Diagnosis not present

## 2020-03-18 DIAGNOSIS — M62830 Muscle spasm of back: Secondary | ICD-10-CM | POA: Diagnosis not present

## 2020-05-10 DIAGNOSIS — J069 Acute upper respiratory infection, unspecified: Secondary | ICD-10-CM | POA: Diagnosis not present

## 2020-10-29 IMAGING — RF UPPER GI SERIES (WITHOUT KUB)
11 of 17 series · 15 of 24 positions shown · non-contrast
Comparison: None.

CLINICAL DATA: 42-year-old female undergoing evaluation for
bariatric surgery.

EXAM:
UPPER GI SERIES WITH KUB
TECHNIQUE: After obtaining a scout radiograph a routine upper GI series was
performed using barium
FLUOROSCOPY TIME:  Fluoroscopy Time:  2 minutes 18 seconds
Radiation Exposure Index (if provided by the fluoroscopic device):
44.3 mGy
Number of Acquired Spot Images: 0

[Series 1: t abdomen supine · 0.15mm/px · 1 of 1 slices shown]
[im 1/1]
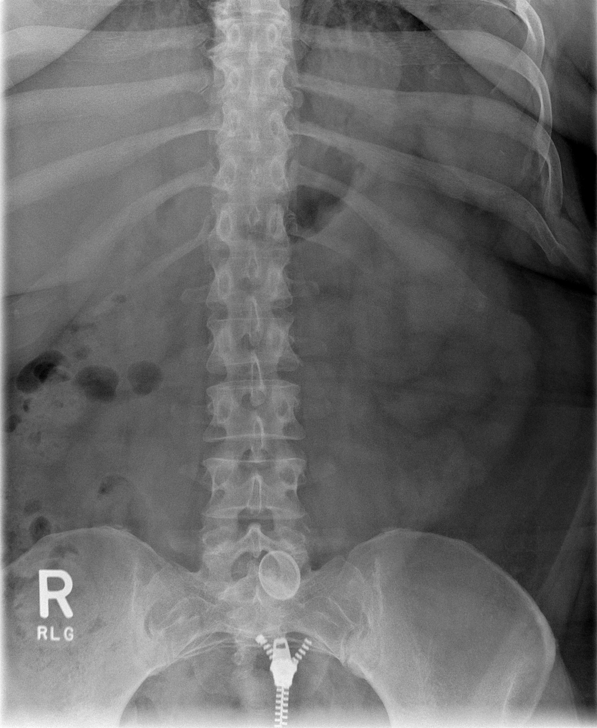

[Series 2: cp_standard · 0.51mm/px · 1 of 71 frames shown (1 of 10)]
[frame 71/71]
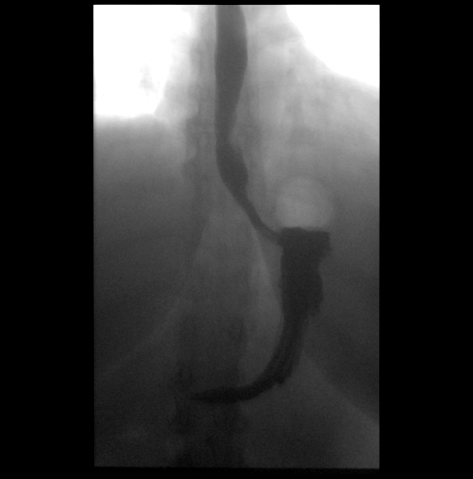

[Series 5: cp_standard · 0.34mm/px · 2 of 29 frames shown (2 of 10)]
[frame 5/29]
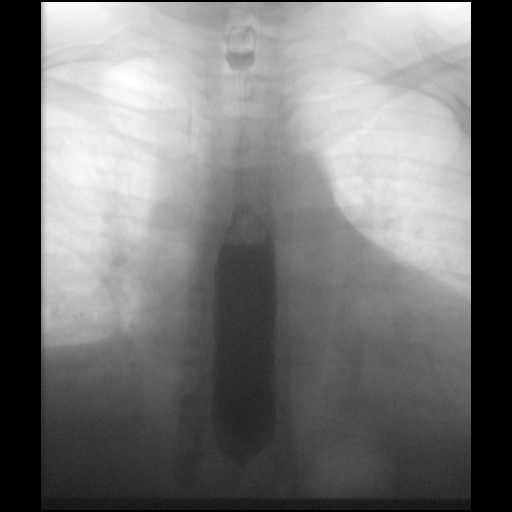
[frame 25/29]
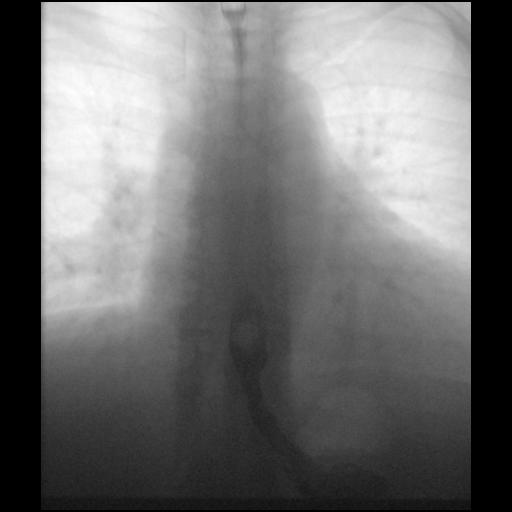

[Series 7: cp_standard · 0.34mm/px · 2 of 45 frames shown (3 of 10)]
[frame 6/45]
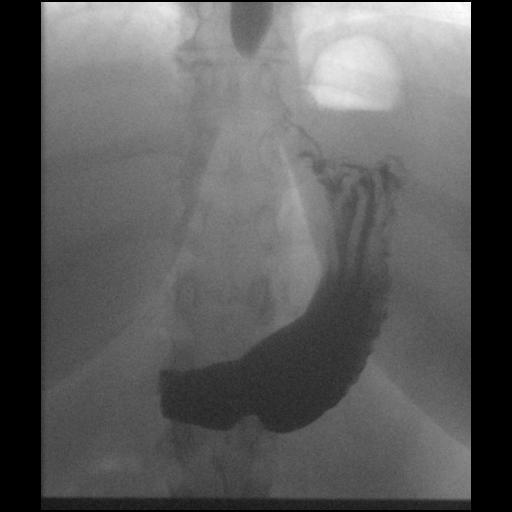
[frame 23/45]
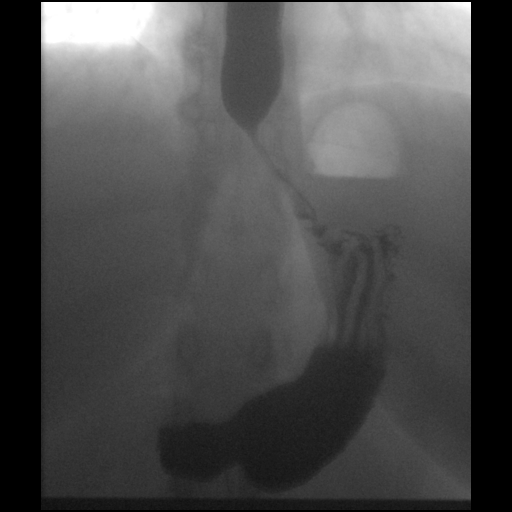

[Series 10: cp_standard · 0.34mm/px · 2 of 43 frames shown (4 of 10)]
[frame 7/43]
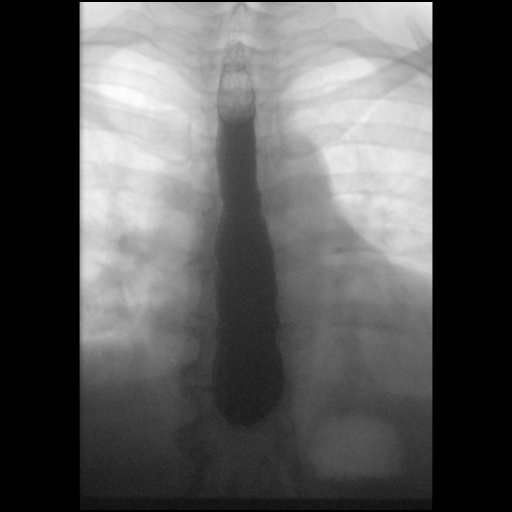
[frame 37/43]
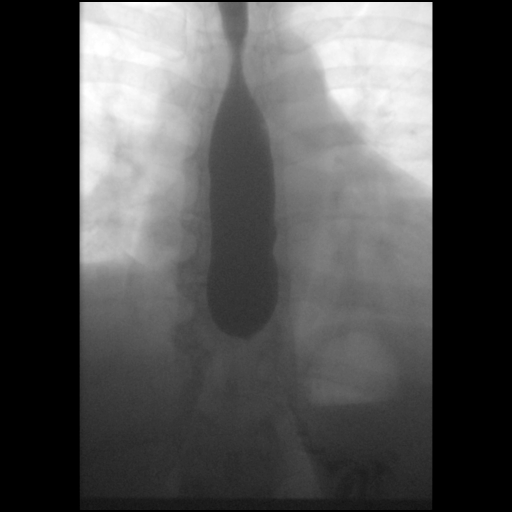

[Series 11: cp_standard · 0.34mm/px · 1 of 73 frames shown (5 of 10)]
[frame 28/73]
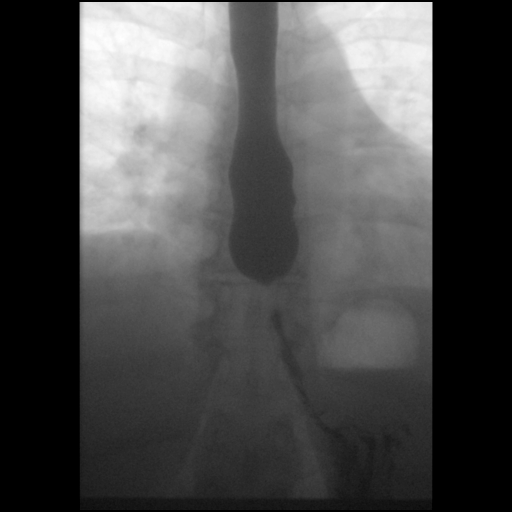

[Series 12: cp_standard · 0.34mm/px · 2 of 79 frames shown (6 of 10)]
[frame 40/79]
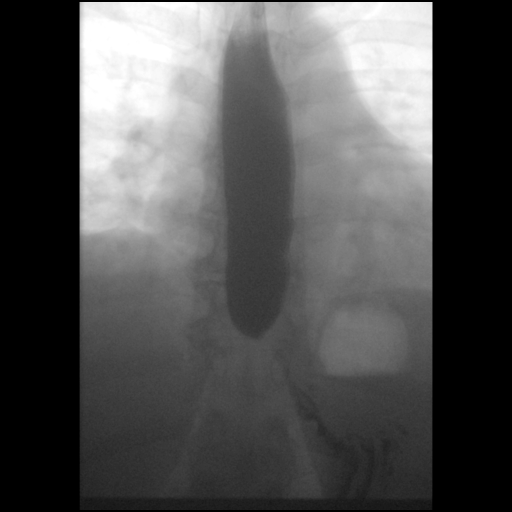
[frame 79/79]
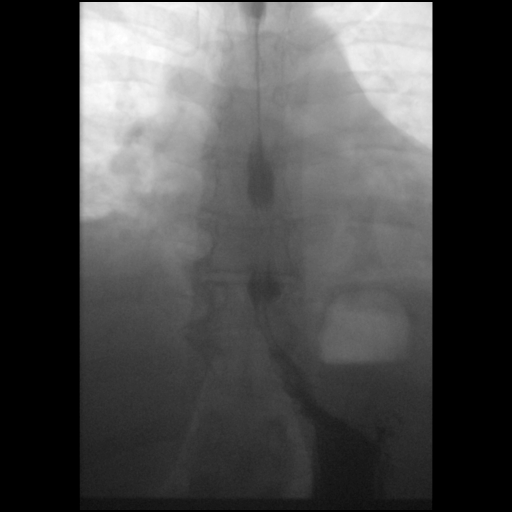

[Series 15: cp_standard · 0.18mm/px · 1 of 1 slices shown (7 of 10)]
[im 1/1]
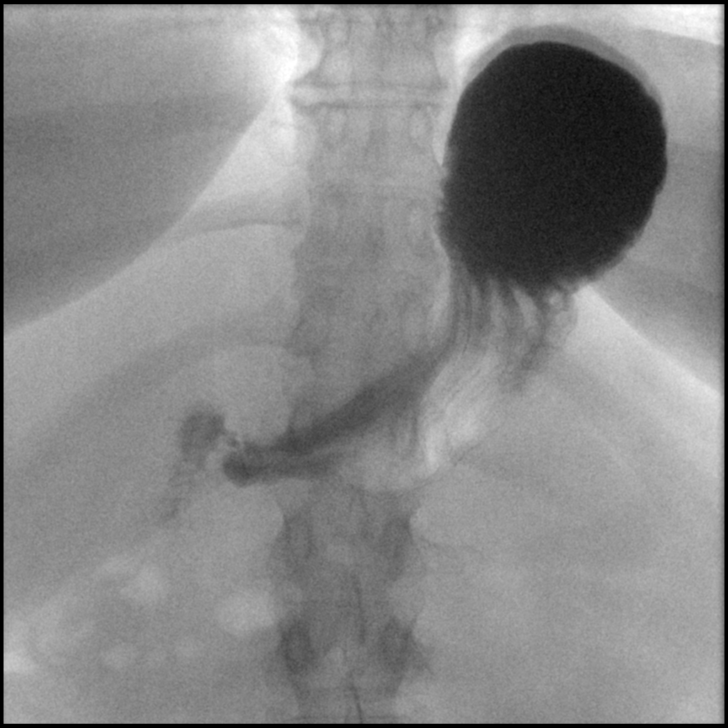

[Series 19: cp_standard · 0.17mm/px · 1 of 1 slices shown (8 of 10)]
[im 1/1]
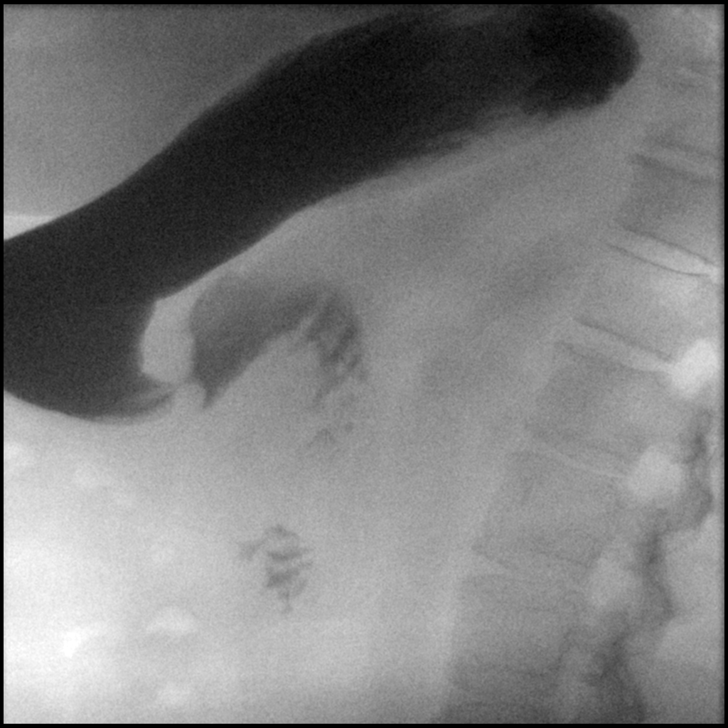

[Series 20: cp_standard · 0.17mm/px · 1 of 1 slices shown (9 of 10)]
[im 1/1]
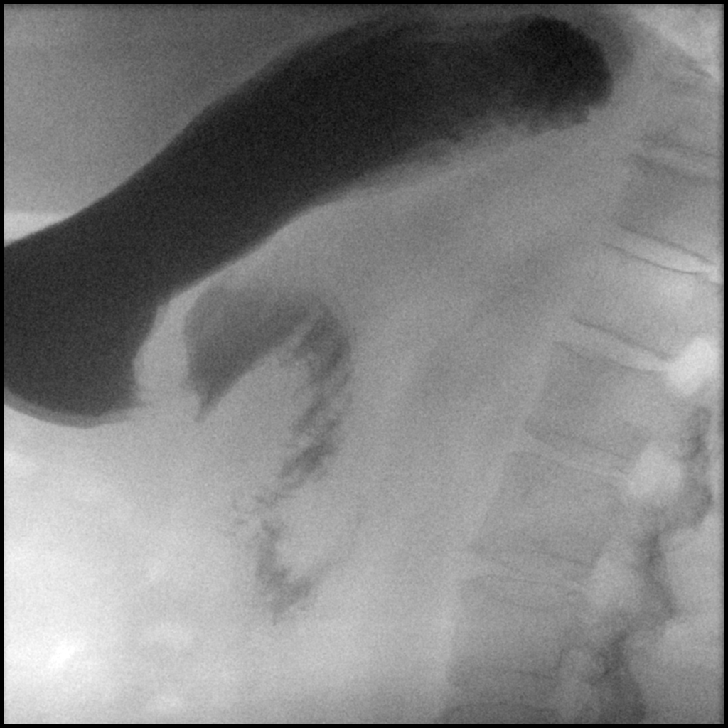

[Series 24: cp_standard · 0.17mm/px · 1 of 1 slices shown (10 of 10)]
[im 1/1]
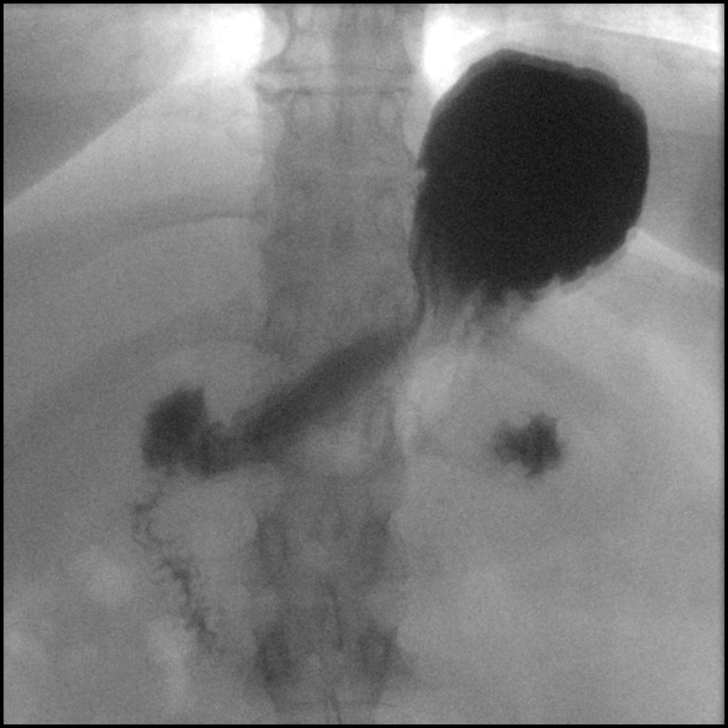

[15 of 24 positions shown; findings below may reference images not displayed]

FINDINGS: A single contrast study was undertaken and the patient tolerated
this well and without difficulty.

Preprocedural scout view demonstrates normal bowel gas pattern,
abdominal visceral contours. Endplate spurring in the thoracic
spine. No acute osseous abnormality identified.

No obstruction to the forward flow of contrast throughout the
esophagus and into the stomach. The mid esophagus is mildly
patulous, and some dysmotility was noted during drinking including
mild tertiary contractions, to and fro motion of contrast.

There is a mild, smooth, circumferential narrowing at the
gastroesophageal junction (series 2, image 62 and series 7, image
3). Upon questioning, the patient denied symptoms of dysphagia or
difficulty with medications.

Otherwise normal gastroesophageal junction. Normal gastric contour.
Fairly prompt gastric emptying. Normal duodenum C-loop configuration
(series 19 and series 24).

No gastroesophageal reflux occurred spontaneously or was elicited.
IMPRESSION: 1. Esophageal dysmotility, in conjunction with a segment of smooth
narrowing distally to the gastroesophageal junction (series 7, image
38). However, this patient denies symptoms of dysphagia or
difficulty swallowing medications.
2. Negative stomach and normal duodenum C-loop.

## 2020-11-19 DIAGNOSIS — Z0000 Encounter for general adult medical examination without abnormal findings: Secondary | ICD-10-CM | POA: Diagnosis not present

## 2020-11-19 DIAGNOSIS — F329 Major depressive disorder, single episode, unspecified: Secondary | ICD-10-CM | POA: Diagnosis not present

## 2020-11-19 DIAGNOSIS — K219 Gastro-esophageal reflux disease without esophagitis: Secondary | ICD-10-CM | POA: Diagnosis not present

## 2020-11-19 DIAGNOSIS — N3000 Acute cystitis without hematuria: Secondary | ICD-10-CM | POA: Diagnosis not present

## 2020-11-19 DIAGNOSIS — B373 Candidiasis of vulva and vagina: Secondary | ICD-10-CM | POA: Diagnosis not present

## 2020-11-19 DIAGNOSIS — Z6841 Body Mass Index (BMI) 40.0 and over, adult: Secondary | ICD-10-CM | POA: Diagnosis not present

## 2020-11-19 DIAGNOSIS — Z01818 Encounter for other preprocedural examination: Secondary | ICD-10-CM | POA: Diagnosis not present

## 2020-12-13 DIAGNOSIS — Z01419 Encounter for gynecological examination (general) (routine) without abnormal findings: Secondary | ICD-10-CM | POA: Diagnosis not present

## 2020-12-13 DIAGNOSIS — Z1239 Encounter for other screening for malignant neoplasm of breast: Secondary | ICD-10-CM | POA: Diagnosis not present

## 2020-12-13 DIAGNOSIS — A630 Anogenital (venereal) warts: Secondary | ICD-10-CM | POA: Diagnosis not present

## 2020-12-20 DIAGNOSIS — Z6841 Body Mass Index (BMI) 40.0 and over, adult: Secondary | ICD-10-CM | POA: Diagnosis not present

## 2020-12-20 DIAGNOSIS — K219 Gastro-esophageal reflux disease without esophagitis: Secondary | ICD-10-CM | POA: Diagnosis not present

## 2020-12-20 DIAGNOSIS — F329 Major depressive disorder, single episode, unspecified: Secondary | ICD-10-CM | POA: Diagnosis not present

## 2020-12-27 DIAGNOSIS — Z20828 Contact with and (suspected) exposure to other viral communicable diseases: Secondary | ICD-10-CM | POA: Diagnosis not present

## 2020-12-27 DIAGNOSIS — U071 COVID-19: Secondary | ICD-10-CM | POA: Diagnosis not present

## 2021-03-25 DIAGNOSIS — K219 Gastro-esophageal reflux disease without esophagitis: Secondary | ICD-10-CM | POA: Diagnosis not present

## 2021-03-25 DIAGNOSIS — F329 Major depressive disorder, single episode, unspecified: Secondary | ICD-10-CM | POA: Diagnosis not present

## 2021-03-25 DIAGNOSIS — Z6841 Body Mass Index (BMI) 40.0 and over, adult: Secondary | ICD-10-CM | POA: Diagnosis not present

## 2021-03-25 DIAGNOSIS — R39198 Other difficulties with micturition: Secondary | ICD-10-CM | POA: Diagnosis not present

## 2021-05-13 DIAGNOSIS — Z1159 Encounter for screening for other viral diseases: Secondary | ICD-10-CM | POA: Diagnosis not present

## 2021-05-16 DIAGNOSIS — Z1231 Encounter for screening mammogram for malignant neoplasm of breast: Secondary | ICD-10-CM | POA: Diagnosis not present

## 2021-08-14 DIAGNOSIS — M25511 Pain in right shoulder: Secondary | ICD-10-CM | POA: Diagnosis not present

## 2021-08-14 DIAGNOSIS — M7501 Adhesive capsulitis of right shoulder: Secondary | ICD-10-CM | POA: Diagnosis not present

## 2021-09-01 DIAGNOSIS — M7501 Adhesive capsulitis of right shoulder: Secondary | ICD-10-CM | POA: Diagnosis not present

## 2021-09-01 DIAGNOSIS — M25611 Stiffness of right shoulder, not elsewhere classified: Secondary | ICD-10-CM | POA: Diagnosis not present

## 2021-09-01 DIAGNOSIS — R293 Abnormal posture: Secondary | ICD-10-CM | POA: Diagnosis not present

## 2021-09-01 DIAGNOSIS — M25511 Pain in right shoulder: Secondary | ICD-10-CM | POA: Diagnosis not present

## 2021-09-01 DIAGNOSIS — M25612 Stiffness of left shoulder, not elsewhere classified: Secondary | ICD-10-CM | POA: Diagnosis not present

## 2021-09-15 DIAGNOSIS — M25611 Stiffness of right shoulder, not elsewhere classified: Secondary | ICD-10-CM | POA: Diagnosis not present

## 2021-09-15 DIAGNOSIS — M25511 Pain in right shoulder: Secondary | ICD-10-CM | POA: Diagnosis not present

## 2021-09-15 DIAGNOSIS — M7501 Adhesive capsulitis of right shoulder: Secondary | ICD-10-CM | POA: Diagnosis not present

## 2021-09-15 DIAGNOSIS — M25612 Stiffness of left shoulder, not elsewhere classified: Secondary | ICD-10-CM | POA: Diagnosis not present

## 2021-09-15 DIAGNOSIS — R293 Abnormal posture: Secondary | ICD-10-CM | POA: Diagnosis not present

## 2021-09-23 DIAGNOSIS — R293 Abnormal posture: Secondary | ICD-10-CM | POA: Diagnosis not present

## 2021-09-23 DIAGNOSIS — M25511 Pain in right shoulder: Secondary | ICD-10-CM | POA: Diagnosis not present

## 2021-09-23 DIAGNOSIS — M25611 Stiffness of right shoulder, not elsewhere classified: Secondary | ICD-10-CM | POA: Diagnosis not present

## 2021-09-23 DIAGNOSIS — M25612 Stiffness of left shoulder, not elsewhere classified: Secondary | ICD-10-CM | POA: Diagnosis not present

## 2021-09-23 DIAGNOSIS — M7501 Adhesive capsulitis of right shoulder: Secondary | ICD-10-CM | POA: Diagnosis not present

## 2021-10-03 DIAGNOSIS — M7501 Adhesive capsulitis of right shoulder: Secondary | ICD-10-CM | POA: Diagnosis not present

## 2021-10-07 DIAGNOSIS — M7501 Adhesive capsulitis of right shoulder: Secondary | ICD-10-CM | POA: Diagnosis not present

## 2021-10-08 DIAGNOSIS — F329 Major depressive disorder, single episode, unspecified: Secondary | ICD-10-CM | POA: Diagnosis not present

## 2021-10-23 DIAGNOSIS — F329 Major depressive disorder, single episode, unspecified: Secondary | ICD-10-CM | POA: Diagnosis not present

## 2021-10-23 DIAGNOSIS — N3000 Acute cystitis without hematuria: Secondary | ICD-10-CM | POA: Diagnosis not present

## 2021-10-30 DIAGNOSIS — Z0000 Encounter for general adult medical examination without abnormal findings: Secondary | ICD-10-CM | POA: Diagnosis not present

## 2021-10-30 DIAGNOSIS — Z6841 Body Mass Index (BMI) 40.0 and over, adult: Secondary | ICD-10-CM | POA: Diagnosis not present

## 2021-10-31 DIAGNOSIS — F331 Major depressive disorder, recurrent, moderate: Secondary | ICD-10-CM | POA: Diagnosis not present

## 2021-11-13 DIAGNOSIS — F331 Major depressive disorder, recurrent, moderate: Secondary | ICD-10-CM | POA: Diagnosis not present

## 2021-11-26 DIAGNOSIS — F329 Major depressive disorder, single episode, unspecified: Secondary | ICD-10-CM | POA: Diagnosis not present

## 2021-11-26 DIAGNOSIS — F331 Major depressive disorder, recurrent, moderate: Secondary | ICD-10-CM | POA: Diagnosis not present

## 2021-12-05 DIAGNOSIS — F331 Major depressive disorder, recurrent, moderate: Secondary | ICD-10-CM | POA: Diagnosis not present

## 2021-12-19 DIAGNOSIS — F331 Major depressive disorder, recurrent, moderate: Secondary | ICD-10-CM | POA: Diagnosis not present

## 2022-01-09 DIAGNOSIS — F331 Major depressive disorder, recurrent, moderate: Secondary | ICD-10-CM | POA: Diagnosis not present

## 2022-05-28 DIAGNOSIS — F329 Major depressive disorder, single episode, unspecified: Secondary | ICD-10-CM | POA: Diagnosis not present

## 2022-05-28 DIAGNOSIS — K219 Gastro-esophageal reflux disease without esophagitis: Secondary | ICD-10-CM | POA: Diagnosis not present

## 2023-03-30 ENCOUNTER — Other Ambulatory Visit: Payer: Self-pay | Admitting: Family Medicine

## 2023-03-30 DIAGNOSIS — Z1231 Encounter for screening mammogram for malignant neoplasm of breast: Secondary | ICD-10-CM
